# Patient Record
Sex: Male | Born: 1950 | Race: White | Hispanic: No | Smoking: Current every day smoker
Health system: Southern US, Community
[De-identification: ages and names within clinical notes are randomized; demographics above are authoritative.]

## PROBLEM LIST (undated history)

## (undated) ENCOUNTER — Emergency Department (HOSPITAL_BASED_OUTPATIENT_CLINIC_OR_DEPARTMENT_OTHER): Admission: EM

## (undated) DIAGNOSIS — M1990 Unspecified osteoarthritis, unspecified site: Secondary | ICD-10-CM

## (undated) DIAGNOSIS — E785 Hyperlipidemia, unspecified: Secondary | ICD-10-CM

## (undated) DIAGNOSIS — I10 Essential (primary) hypertension: Secondary | ICD-10-CM

---

## 2004-09-08 ENCOUNTER — Encounter: Admission: RE | Admit: 2004-09-08 | Discharge: 2004-09-08 | Payer: Self-pay | Admitting: Occupational Medicine

## 2006-02-01 IMAGING — CR DG FINGER MIDDLE 2+V*L*
1 series · 1 of 1 positions shown · non-contrast
Comparison: none

CLINICAL DATA: Crush injury with distal middle left finger pain.
 DIAGNOSTIC FINGER MIDDLE LEFT ? 09/08/04: 
 There is no evidence of fracture or dislocation. No other soft tissue or bone abnormalities are identified.

[view not recorded]
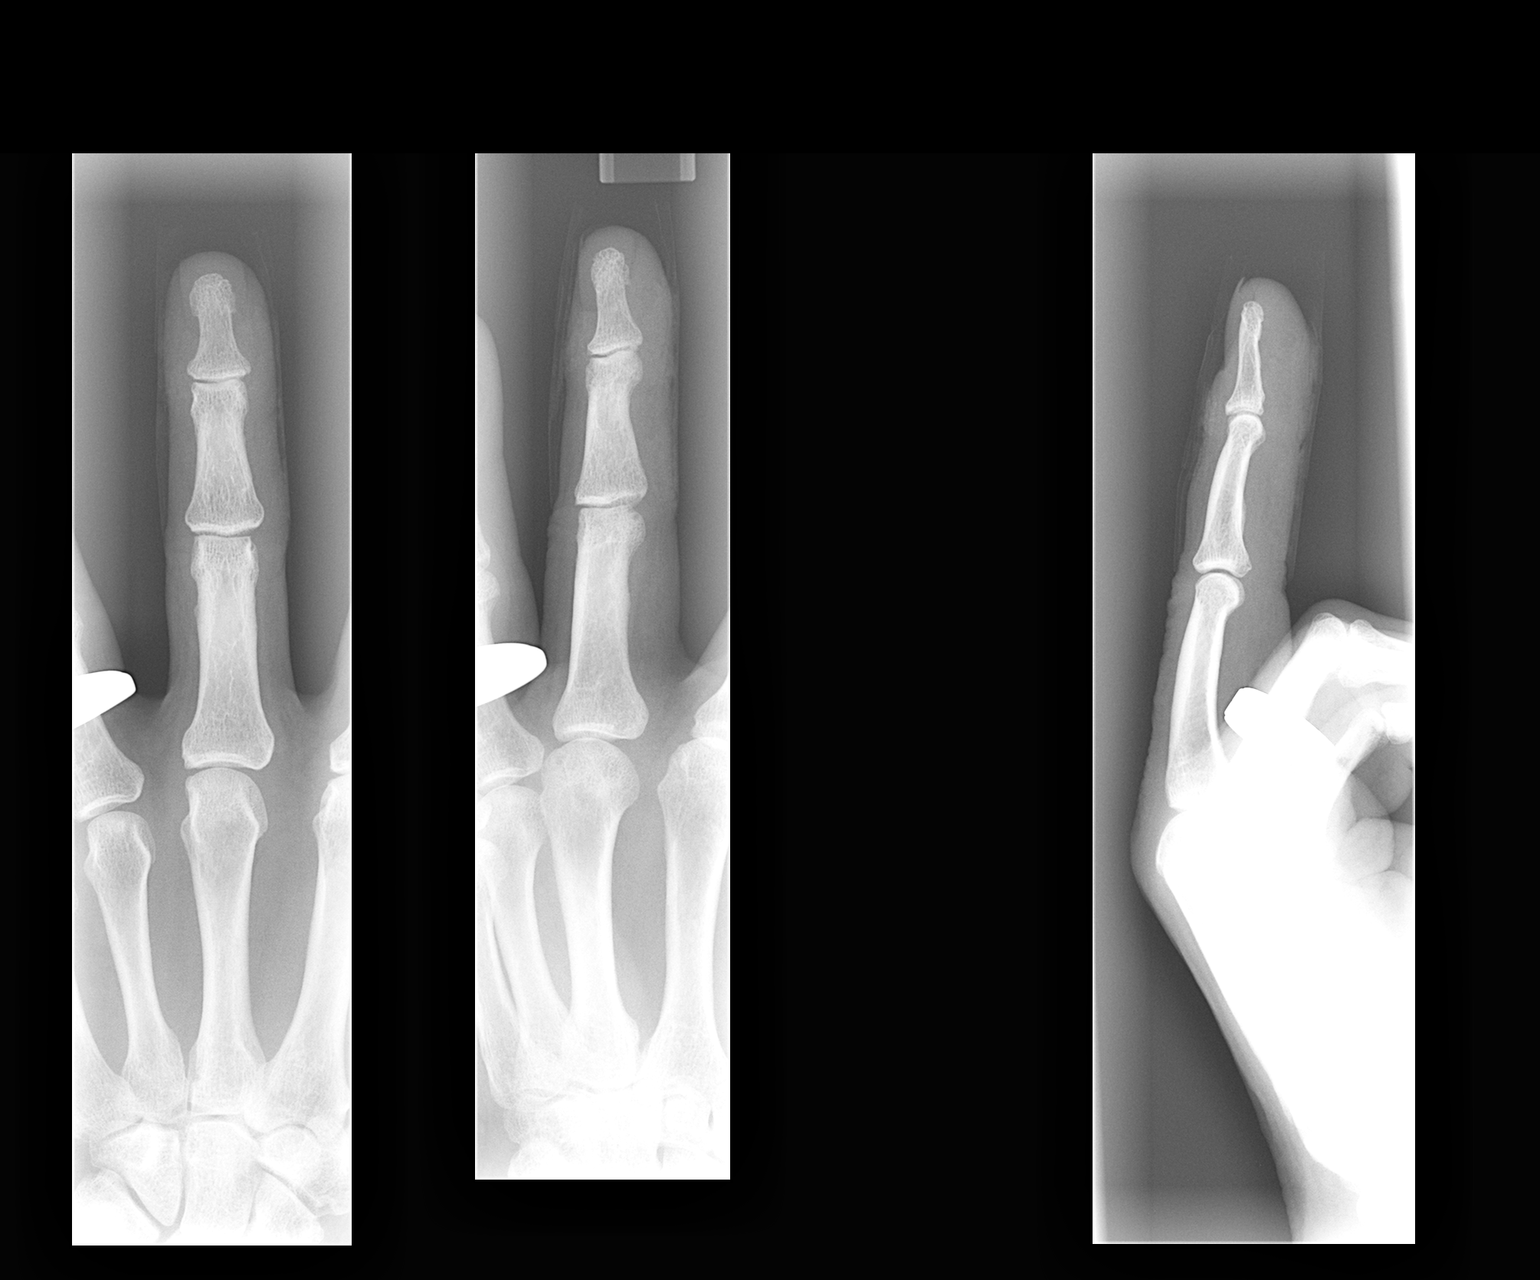

[1 of 1 positions shown; findings below may reference images not displayed]

IMPRESSION: Normal study.

## 2013-01-31 DIAGNOSIS — F17200 Nicotine dependence, unspecified, uncomplicated: Secondary | ICD-10-CM | POA: Insufficient documentation

## 2013-01-31 DIAGNOSIS — I10 Essential (primary) hypertension: Secondary | ICD-10-CM | POA: Insufficient documentation

## 2013-01-31 DIAGNOSIS — E785 Hyperlipidemia, unspecified: Secondary | ICD-10-CM | POA: Insufficient documentation

## 2013-01-31 DIAGNOSIS — H1045 Other chronic allergic conjunctivitis: Secondary | ICD-10-CM | POA: Insufficient documentation

## 2013-07-22 DIAGNOSIS — R059 Cough, unspecified: Secondary | ICD-10-CM | POA: Insufficient documentation

## 2013-10-04 DIAGNOSIS — M545 Low back pain, unspecified: Secondary | ICD-10-CM | POA: Insufficient documentation

## 2014-01-17 DIAGNOSIS — G4700 Insomnia, unspecified: Secondary | ICD-10-CM | POA: Insufficient documentation

## 2014-01-26 DIAGNOSIS — E119 Type 2 diabetes mellitus without complications: Secondary | ICD-10-CM | POA: Insufficient documentation

## 2014-03-18 DIAGNOSIS — M19049 Primary osteoarthritis, unspecified hand: Secondary | ICD-10-CM | POA: Insufficient documentation

## 2014-12-10 DIAGNOSIS — M25541 Pain in joints of right hand: Secondary | ICD-10-CM | POA: Insufficient documentation

## 2015-03-25 DIAGNOSIS — I714 Abdominal aortic aneurysm, without rupture, unspecified: Secondary | ICD-10-CM | POA: Insufficient documentation

## 2015-03-29 DIAGNOSIS — Z9889 Other specified postprocedural states: Secondary | ICD-10-CM | POA: Insufficient documentation

## 2017-01-08 DIAGNOSIS — H3530 Unspecified macular degeneration: Secondary | ICD-10-CM | POA: Insufficient documentation

## 2017-05-22 DIAGNOSIS — M479 Spondylosis, unspecified: Secondary | ICD-10-CM | POA: Insufficient documentation

## 2018-02-21 DIAGNOSIS — Z720 Tobacco use: Secondary | ICD-10-CM | POA: Diagnosis not present

## 2018-04-18 DIAGNOSIS — R7303 Prediabetes: Secondary | ICD-10-CM | POA: Diagnosis not present

## 2018-04-18 DIAGNOSIS — Z7982 Long term (current) use of aspirin: Secondary | ICD-10-CM | POA: Diagnosis not present

## 2018-04-18 DIAGNOSIS — I10 Essential (primary) hypertension: Secondary | ICD-10-CM | POA: Diagnosis not present

## 2018-04-18 DIAGNOSIS — Z87891 Personal history of nicotine dependence: Secondary | ICD-10-CM | POA: Diagnosis not present

## 2018-04-18 DIAGNOSIS — E785 Hyperlipidemia, unspecified: Secondary | ICD-10-CM | POA: Diagnosis not present

## 2018-05-23 DIAGNOSIS — Z23 Encounter for immunization: Secondary | ICD-10-CM | POA: Diagnosis not present

## 2018-06-13 DIAGNOSIS — H524 Presbyopia: Secondary | ICD-10-CM | POA: Diagnosis not present

## 2018-06-13 DIAGNOSIS — H2513 Age-related nuclear cataract, bilateral: Secondary | ICD-10-CM | POA: Diagnosis not present

## 2018-06-13 DIAGNOSIS — H353132 Nonexudative age-related macular degeneration, bilateral, intermediate dry stage: Secondary | ICD-10-CM | POA: Diagnosis not present

## 2018-06-13 DIAGNOSIS — D23121 Other benign neoplasm of skin of left upper eyelid, including canthus: Secondary | ICD-10-CM | POA: Diagnosis not present

## 2018-06-13 DIAGNOSIS — H04123 Dry eye syndrome of bilateral lacrimal glands: Secondary | ICD-10-CM | POA: Diagnosis not present

## 2018-06-20 DIAGNOSIS — H2513 Age-related nuclear cataract, bilateral: Secondary | ICD-10-CM | POA: Diagnosis not present

## 2018-06-20 DIAGNOSIS — H353132 Nonexudative age-related macular degeneration, bilateral, intermediate dry stage: Secondary | ICD-10-CM | POA: Diagnosis not present

## 2018-06-20 DIAGNOSIS — H21543 Posterior synechiae (iris), bilateral: Secondary | ICD-10-CM | POA: Diagnosis not present

## 2018-06-20 DIAGNOSIS — H40033 Anatomical narrow angle, bilateral: Secondary | ICD-10-CM | POA: Diagnosis not present

## 2018-06-20 DIAGNOSIS — H04123 Dry eye syndrome of bilateral lacrimal glands: Secondary | ICD-10-CM | POA: Diagnosis not present

## 2019-02-12 DIAGNOSIS — E089 Diabetes mellitus due to underlying condition without complications: Secondary | ICD-10-CM | POA: Diagnosis not present

## 2019-02-12 DIAGNOSIS — I10 Essential (primary) hypertension: Secondary | ICD-10-CM | POA: Diagnosis not present

## 2019-02-12 DIAGNOSIS — R7303 Prediabetes: Secondary | ICD-10-CM | POA: Diagnosis not present

## 2019-02-12 DIAGNOSIS — I714 Abdominal aortic aneurysm, without rupture: Secondary | ICD-10-CM | POA: Diagnosis not present

## 2019-02-12 DIAGNOSIS — E785 Hyperlipidemia, unspecified: Secondary | ICD-10-CM | POA: Diagnosis not present

## 2019-04-20 DIAGNOSIS — G8929 Other chronic pain: Secondary | ICD-10-CM | POA: Diagnosis not present

## 2019-04-20 DIAGNOSIS — M5442 Lumbago with sciatica, left side: Secondary | ICD-10-CM | POA: Diagnosis not present

## 2019-04-20 DIAGNOSIS — M545 Low back pain: Secondary | ICD-10-CM | POA: Diagnosis not present

## 2019-06-03 DIAGNOSIS — Z23 Encounter for immunization: Secondary | ICD-10-CM | POA: Diagnosis not present

## 2019-06-23 DIAGNOSIS — M7542 Impingement syndrome of left shoulder: Secondary | ICD-10-CM | POA: Diagnosis not present

## 2019-07-09 DIAGNOSIS — M7542 Impingement syndrome of left shoulder: Secondary | ICD-10-CM | POA: Diagnosis not present

## 2019-07-09 DIAGNOSIS — M25512 Pain in left shoulder: Secondary | ICD-10-CM | POA: Diagnosis not present

## 2019-08-14 DIAGNOSIS — I10 Essential (primary) hypertension: Secondary | ICD-10-CM | POA: Diagnosis not present

## 2019-08-14 DIAGNOSIS — R7303 Prediabetes: Secondary | ICD-10-CM | POA: Diagnosis not present

## 2019-08-14 DIAGNOSIS — F17210 Nicotine dependence, cigarettes, uncomplicated: Secondary | ICD-10-CM | POA: Diagnosis not present

## 2019-08-14 DIAGNOSIS — M7542 Impingement syndrome of left shoulder: Secondary | ICD-10-CM | POA: Diagnosis not present

## 2019-08-14 DIAGNOSIS — E785 Hyperlipidemia, unspecified: Secondary | ICD-10-CM | POA: Diagnosis not present

## 2019-09-03 DIAGNOSIS — M75102 Unspecified rotator cuff tear or rupture of left shoulder, not specified as traumatic: Secondary | ICD-10-CM | POA: Diagnosis not present

## 2019-09-14 DIAGNOSIS — S43402A Unspecified sprain of left shoulder joint, initial encounter: Secondary | ICD-10-CM | POA: Diagnosis not present

## 2019-09-14 DIAGNOSIS — M75102 Unspecified rotator cuff tear or rupture of left shoulder, not specified as traumatic: Secondary | ICD-10-CM | POA: Diagnosis not present

## 2019-09-14 DIAGNOSIS — S46012A Strain of muscle(s) and tendon(s) of the rotator cuff of left shoulder, initial encounter: Secondary | ICD-10-CM | POA: Diagnosis not present

## 2019-09-17 DIAGNOSIS — M75102 Unspecified rotator cuff tear or rupture of left shoulder, not specified as traumatic: Secondary | ICD-10-CM | POA: Diagnosis not present

## 2019-09-23 DIAGNOSIS — Z01818 Encounter for other preprocedural examination: Secondary | ICD-10-CM | POA: Diagnosis not present

## 2019-09-23 DIAGNOSIS — I493 Ventricular premature depolarization: Secondary | ICD-10-CM | POA: Diagnosis not present

## 2019-09-23 DIAGNOSIS — M75102 Unspecified rotator cuff tear or rupture of left shoulder, not specified as traumatic: Secondary | ICD-10-CM | POA: Diagnosis not present

## 2019-09-23 DIAGNOSIS — R9431 Abnormal electrocardiogram [ECG] [EKG]: Secondary | ICD-10-CM | POA: Diagnosis not present

## 2019-09-23 DIAGNOSIS — I498 Other specified cardiac arrhythmias: Secondary | ICD-10-CM | POA: Diagnosis not present

## 2019-09-24 DIAGNOSIS — M75102 Unspecified rotator cuff tear or rupture of left shoulder, not specified as traumatic: Secondary | ICD-10-CM | POA: Diagnosis not present

## 2019-09-24 DIAGNOSIS — Z20822 Contact with and (suspected) exposure to covid-19: Secondary | ICD-10-CM | POA: Diagnosis not present

## 2019-09-24 DIAGNOSIS — Z01812 Encounter for preprocedural laboratory examination: Secondary | ICD-10-CM | POA: Diagnosis not present

## 2019-09-30 DIAGNOSIS — M65812 Other synovitis and tenosynovitis, left shoulder: Secondary | ICD-10-CM | POA: Diagnosis not present

## 2019-09-30 DIAGNOSIS — M75102 Unspecified rotator cuff tear or rupture of left shoulder, not specified as traumatic: Secondary | ICD-10-CM | POA: Diagnosis not present

## 2019-09-30 DIAGNOSIS — M75122 Complete rotator cuff tear or rupture of left shoulder, not specified as traumatic: Secondary | ICD-10-CM | POA: Diagnosis not present

## 2019-09-30 DIAGNOSIS — M7552 Bursitis of left shoulder: Secondary | ICD-10-CM | POA: Diagnosis not present

## 2019-09-30 DIAGNOSIS — G8918 Other acute postprocedural pain: Secondary | ICD-10-CM | POA: Diagnosis not present

## 2019-10-07 DIAGNOSIS — Z4789 Encounter for other orthopedic aftercare: Secondary | ICD-10-CM | POA: Diagnosis not present

## 2019-10-13 DIAGNOSIS — F329 Major depressive disorder, single episode, unspecified: Secondary | ICD-10-CM | POA: Diagnosis not present

## 2019-10-13 DIAGNOSIS — N529 Male erectile dysfunction, unspecified: Secondary | ICD-10-CM | POA: Diagnosis not present

## 2019-10-13 DIAGNOSIS — Z0000 Encounter for general adult medical examination without abnormal findings: Secondary | ICD-10-CM | POA: Diagnosis not present

## 2019-10-13 DIAGNOSIS — I714 Abdominal aortic aneurysm, without rupture: Secondary | ICD-10-CM | POA: Diagnosis not present

## 2019-10-13 DIAGNOSIS — G4700 Insomnia, unspecified: Secondary | ICD-10-CM | POA: Diagnosis not present

## 2019-10-13 DIAGNOSIS — E089 Diabetes mellitus due to underlying condition without complications: Secondary | ICD-10-CM | POA: Diagnosis not present

## 2019-10-14 DIAGNOSIS — M75102 Unspecified rotator cuff tear or rupture of left shoulder, not specified as traumatic: Secondary | ICD-10-CM | POA: Diagnosis not present

## 2019-10-14 DIAGNOSIS — M25612 Stiffness of left shoulder, not elsewhere classified: Secondary | ICD-10-CM | POA: Diagnosis not present

## 2019-10-14 DIAGNOSIS — M6281 Muscle weakness (generalized): Secondary | ICD-10-CM | POA: Diagnosis not present

## 2019-10-14 DIAGNOSIS — M25512 Pain in left shoulder: Secondary | ICD-10-CM | POA: Diagnosis not present

## 2019-10-21 DIAGNOSIS — Z4789 Encounter for other orthopedic aftercare: Secondary | ICD-10-CM | POA: Diagnosis not present

## 2019-10-29 DIAGNOSIS — Z4789 Encounter for other orthopedic aftercare: Secondary | ICD-10-CM | POA: Diagnosis not present

## 2019-11-05 DIAGNOSIS — M6281 Muscle weakness (generalized): Secondary | ICD-10-CM | POA: Diagnosis not present

## 2019-11-05 DIAGNOSIS — M25612 Stiffness of left shoulder, not elsewhere classified: Secondary | ICD-10-CM | POA: Diagnosis not present

## 2019-11-05 DIAGNOSIS — Z4789 Encounter for other orthopedic aftercare: Secondary | ICD-10-CM | POA: Diagnosis not present

## 2019-11-05 DIAGNOSIS — M25512 Pain in left shoulder: Secondary | ICD-10-CM | POA: Diagnosis not present

## 2019-11-10 DIAGNOSIS — M25612 Stiffness of left shoulder, not elsewhere classified: Secondary | ICD-10-CM | POA: Diagnosis not present

## 2019-11-10 DIAGNOSIS — M6281 Muscle weakness (generalized): Secondary | ICD-10-CM | POA: Diagnosis not present

## 2019-11-10 DIAGNOSIS — Z4789 Encounter for other orthopedic aftercare: Secondary | ICD-10-CM | POA: Diagnosis not present

## 2019-11-10 DIAGNOSIS — M25512 Pain in left shoulder: Secondary | ICD-10-CM | POA: Diagnosis not present

## 2019-11-13 DIAGNOSIS — M79651 Pain in right thigh: Secondary | ICD-10-CM | POA: Diagnosis not present

## 2019-11-17 DIAGNOSIS — M25612 Stiffness of left shoulder, not elsewhere classified: Secondary | ICD-10-CM | POA: Diagnosis not present

## 2019-11-17 DIAGNOSIS — Z4789 Encounter for other orthopedic aftercare: Secondary | ICD-10-CM | POA: Diagnosis not present

## 2019-11-17 DIAGNOSIS — M6281 Muscle weakness (generalized): Secondary | ICD-10-CM | POA: Diagnosis not present

## 2019-11-17 DIAGNOSIS — M25512 Pain in left shoulder: Secondary | ICD-10-CM | POA: Diagnosis not present

## 2019-11-19 DIAGNOSIS — M6281 Muscle weakness (generalized): Secondary | ICD-10-CM | POA: Diagnosis not present

## 2019-11-19 DIAGNOSIS — M25612 Stiffness of left shoulder, not elsewhere classified: Secondary | ICD-10-CM | POA: Diagnosis not present

## 2019-11-19 DIAGNOSIS — Z4789 Encounter for other orthopedic aftercare: Secondary | ICD-10-CM | POA: Diagnosis not present

## 2019-11-19 DIAGNOSIS — M25512 Pain in left shoulder: Secondary | ICD-10-CM | POA: Diagnosis not present

## 2019-12-11 DIAGNOSIS — D23122 Other benign neoplasm of skin of left lower eyelid, including canthus: Secondary | ICD-10-CM | POA: Diagnosis not present

## 2019-12-11 DIAGNOSIS — H21543 Posterior synechiae (iris), bilateral: Secondary | ICD-10-CM | POA: Diagnosis not present

## 2019-12-11 DIAGNOSIS — H2513 Age-related nuclear cataract, bilateral: Secondary | ICD-10-CM | POA: Diagnosis not present

## 2019-12-11 DIAGNOSIS — H04123 Dry eye syndrome of bilateral lacrimal glands: Secondary | ICD-10-CM | POA: Diagnosis not present

## 2019-12-11 DIAGNOSIS — H40033 Anatomical narrow angle, bilateral: Secondary | ICD-10-CM | POA: Diagnosis not present

## 2019-12-11 DIAGNOSIS — H353132 Nonexudative age-related macular degeneration, bilateral, intermediate dry stage: Secondary | ICD-10-CM | POA: Diagnosis not present

## 2020-01-13 DIAGNOSIS — H2513 Age-related nuclear cataract, bilateral: Secondary | ICD-10-CM | POA: Diagnosis not present

## 2020-01-13 DIAGNOSIS — H2512 Age-related nuclear cataract, left eye: Secondary | ICD-10-CM | POA: Diagnosis not present

## 2020-01-26 DIAGNOSIS — H21542 Posterior synechiae (iris), left eye: Secondary | ICD-10-CM | POA: Diagnosis not present

## 2020-01-26 DIAGNOSIS — H2512 Age-related nuclear cataract, left eye: Secondary | ICD-10-CM | POA: Diagnosis not present

## 2020-01-26 DIAGNOSIS — H21543 Posterior synechiae (iris), bilateral: Secondary | ICD-10-CM | POA: Diagnosis not present

## 2020-02-05 DIAGNOSIS — H2511 Age-related nuclear cataract, right eye: Secondary | ICD-10-CM | POA: Diagnosis not present

## 2020-03-15 DIAGNOSIS — H21541 Posterior synechiae (iris), right eye: Secondary | ICD-10-CM | POA: Diagnosis not present

## 2020-03-15 DIAGNOSIS — H21543 Posterior synechiae (iris), bilateral: Secondary | ICD-10-CM | POA: Diagnosis not present

## 2020-03-15 DIAGNOSIS — H2511 Age-related nuclear cataract, right eye: Secondary | ICD-10-CM | POA: Diagnosis not present

## 2020-05-05 DIAGNOSIS — L723 Sebaceous cyst: Secondary | ICD-10-CM | POA: Diagnosis not present

## 2020-05-05 DIAGNOSIS — L03317 Cellulitis of buttock: Secondary | ICD-10-CM | POA: Diagnosis not present

## 2020-05-10 DIAGNOSIS — B957 Other staphylococcus as the cause of diseases classified elsewhere: Secondary | ICD-10-CM | POA: Diagnosis not present

## 2020-05-10 DIAGNOSIS — L98491 Non-pressure chronic ulcer of skin of other sites limited to breakdown of skin: Secondary | ICD-10-CM | POA: Diagnosis not present

## 2020-05-13 DIAGNOSIS — L98491 Non-pressure chronic ulcer of skin of other sites limited to breakdown of skin: Secondary | ICD-10-CM | POA: Diagnosis not present

## 2020-05-19 DIAGNOSIS — L98491 Non-pressure chronic ulcer of skin of other sites limited to breakdown of skin: Secondary | ICD-10-CM | POA: Diagnosis not present

## 2020-05-26 DIAGNOSIS — L98422 Non-pressure chronic ulcer of back with fat layer exposed: Secondary | ICD-10-CM | POA: Diagnosis not present

## 2020-05-26 DIAGNOSIS — E08622 Diabetes mellitus due to underlying condition with other skin ulcer: Secondary | ICD-10-CM | POA: Diagnosis not present

## 2020-05-26 DIAGNOSIS — E11622 Type 2 diabetes mellitus with other skin ulcer: Secondary | ICD-10-CM | POA: Diagnosis not present

## 2020-05-31 DIAGNOSIS — E08622 Diabetes mellitus due to underlying condition with other skin ulcer: Secondary | ICD-10-CM | POA: Diagnosis not present

## 2020-05-31 DIAGNOSIS — L98422 Non-pressure chronic ulcer of back with fat layer exposed: Secondary | ICD-10-CM | POA: Diagnosis not present

## 2020-06-29 DIAGNOSIS — E08622 Diabetes mellitus due to underlying condition with other skin ulcer: Secondary | ICD-10-CM | POA: Diagnosis not present

## 2020-06-29 DIAGNOSIS — L98422 Non-pressure chronic ulcer of back with fat layer exposed: Secondary | ICD-10-CM | POA: Diagnosis not present

## 2020-08-02 DIAGNOSIS — E08622 Diabetes mellitus due to underlying condition with other skin ulcer: Secondary | ICD-10-CM | POA: Diagnosis not present

## 2020-08-02 DIAGNOSIS — L98422 Non-pressure chronic ulcer of back with fat layer exposed: Secondary | ICD-10-CM | POA: Diagnosis not present

## 2020-10-18 DIAGNOSIS — Z0001 Encounter for general adult medical examination with abnormal findings: Secondary | ICD-10-CM | POA: Diagnosis not present

## 2020-10-18 DIAGNOSIS — I10 Essential (primary) hypertension: Secondary | ICD-10-CM | POA: Diagnosis not present

## 2020-10-18 DIAGNOSIS — Z0000 Encounter for general adult medical examination without abnormal findings: Secondary | ICD-10-CM | POA: Diagnosis not present

## 2020-10-18 DIAGNOSIS — Z125 Encounter for screening for malignant neoplasm of prostate: Secondary | ICD-10-CM | POA: Diagnosis not present

## 2020-10-18 DIAGNOSIS — N529 Male erectile dysfunction, unspecified: Secondary | ICD-10-CM | POA: Diagnosis not present

## 2020-10-18 DIAGNOSIS — F17210 Nicotine dependence, cigarettes, uncomplicated: Secondary | ICD-10-CM | POA: Diagnosis not present

## 2020-10-18 DIAGNOSIS — E785 Hyperlipidemia, unspecified: Secondary | ICD-10-CM | POA: Diagnosis not present

## 2020-10-18 DIAGNOSIS — E089 Diabetes mellitus due to underlying condition without complications: Secondary | ICD-10-CM | POA: Diagnosis not present

## 2020-10-18 DIAGNOSIS — Z23 Encounter for immunization: Secondary | ICD-10-CM | POA: Diagnosis not present

## 2020-10-18 DIAGNOSIS — I714 Abdominal aortic aneurysm, without rupture: Secondary | ICD-10-CM | POA: Diagnosis not present

## 2020-11-04 DIAGNOSIS — H21543 Posterior synechiae (iris), bilateral: Secondary | ICD-10-CM | POA: Diagnosis not present

## 2020-11-04 DIAGNOSIS — H04123 Dry eye syndrome of bilateral lacrimal glands: Secondary | ICD-10-CM | POA: Diagnosis not present

## 2020-11-04 DIAGNOSIS — H353132 Nonexudative age-related macular degeneration, bilateral, intermediate dry stage: Secondary | ICD-10-CM | POA: Diagnosis not present

## 2020-11-04 DIAGNOSIS — D23122 Other benign neoplasm of skin of left lower eyelid, including canthus: Secondary | ICD-10-CM | POA: Diagnosis not present

## 2020-11-04 DIAGNOSIS — H43823 Vitreomacular adhesion, bilateral: Secondary | ICD-10-CM | POA: Diagnosis not present

## 2020-11-18 DIAGNOSIS — H02413 Mechanical ptosis of bilateral eyelids: Secondary | ICD-10-CM | POA: Diagnosis not present

## 2020-11-18 DIAGNOSIS — D485 Neoplasm of uncertain behavior of skin: Secondary | ICD-10-CM | POA: Diagnosis not present

## 2020-11-18 DIAGNOSIS — H02834 Dermatochalasis of left upper eyelid: Secondary | ICD-10-CM | POA: Diagnosis not present

## 2020-11-18 DIAGNOSIS — H02831 Dermatochalasis of right upper eyelid: Secondary | ICD-10-CM | POA: Diagnosis not present

## 2020-11-18 DIAGNOSIS — H57813 Brow ptosis, bilateral: Secondary | ICD-10-CM | POA: Diagnosis not present

## 2020-11-18 DIAGNOSIS — D22112 Melanocytic nevi of right lower eyelid, including canthus: Secondary | ICD-10-CM | POA: Diagnosis not present

## 2021-01-02 DIAGNOSIS — B028 Zoster with other complications: Secondary | ICD-10-CM | POA: Diagnosis not present

## 2021-01-03 DIAGNOSIS — D210 Benign neoplasm of connective and other soft tissue of head, face and neck: Secondary | ICD-10-CM | POA: Diagnosis not present

## 2021-01-03 DIAGNOSIS — D485 Neoplasm of uncertain behavior of skin: Secondary | ICD-10-CM | POA: Diagnosis not present

## 2021-04-06 DIAGNOSIS — E119 Type 2 diabetes mellitus without complications: Secondary | ICD-10-CM | POA: Diagnosis not present

## 2021-04-06 DIAGNOSIS — I10 Essential (primary) hypertension: Secondary | ICD-10-CM | POA: Diagnosis not present

## 2021-04-06 DIAGNOSIS — L239 Allergic contact dermatitis, unspecified cause: Secondary | ICD-10-CM | POA: Diagnosis not present

## 2021-04-06 DIAGNOSIS — Z79899 Other long term (current) drug therapy: Secondary | ICD-10-CM | POA: Diagnosis not present

## 2021-04-06 DIAGNOSIS — D72829 Elevated white blood cell count, unspecified: Secondary | ICD-10-CM | POA: Diagnosis not present

## 2021-04-06 DIAGNOSIS — F17210 Nicotine dependence, cigarettes, uncomplicated: Secondary | ICD-10-CM | POA: Diagnosis not present

## 2021-04-06 DIAGNOSIS — I714 Abdominal aortic aneurysm, without rupture: Secondary | ICD-10-CM | POA: Diagnosis not present

## 2021-04-06 DIAGNOSIS — E089 Diabetes mellitus due to underlying condition without complications: Secondary | ICD-10-CM | POA: Diagnosis not present

## 2021-04-06 DIAGNOSIS — E785 Hyperlipidemia, unspecified: Secondary | ICD-10-CM | POA: Diagnosis not present

## 2021-04-06 DIAGNOSIS — Z7982 Long term (current) use of aspirin: Secondary | ICD-10-CM | POA: Diagnosis not present

## 2022-09-04 ENCOUNTER — Emergency Department (HOSPITAL_BASED_OUTPATIENT_CLINIC_OR_DEPARTMENT_OTHER): Payer: Medicare PPO

## 2022-09-04 ENCOUNTER — Emergency Department (HOSPITAL_BASED_OUTPATIENT_CLINIC_OR_DEPARTMENT_OTHER)
Admission: EM | Admit: 2022-09-04 | Discharge: 2022-09-04 | Disposition: A | Discharge status: Home / Self Care | Payer: Medicare PPO | Attending: Emergency Medicine | Admitting: Emergency Medicine

## 2022-09-04 ENCOUNTER — Encounter (HOSPITAL_BASED_OUTPATIENT_CLINIC_OR_DEPARTMENT_OTHER): Payer: Self-pay

## 2022-09-04 DIAGNOSIS — M79641 Pain in right hand: Secondary | ICD-10-CM | POA: Diagnosis not present

## 2022-09-04 DIAGNOSIS — M1990 Unspecified osteoarthritis, unspecified site: Secondary | ICD-10-CM

## 2022-09-04 HISTORY — DX: Essential (primary) hypertension: I10

## 2022-09-04 HISTORY — DX: Hyperlipidemia, unspecified: E78.5

## 2022-09-04 MED ORDER — METHYLPREDNISOLONE 4 MG PO TBPK: ORAL_TABLET | ORAL | 0 refills | Status: DC

## 2022-09-04 MED ORDER — PREDNISONE 10 MG PO TABS: 30.0000 mg | ORAL_TABLET | Freq: Every day | ORAL | 0 refills | Status: DC

## 2022-09-04 MED ORDER — HYDROCODONE-ACETAMINOPHEN 5-325 MG PO TABS: 1.0000 | ORAL_TABLET | Freq: Once | ORAL | Status: DC

## 2022-09-04 MED ORDER — ZIKS ARTHRITIS PAIN RELIEF 0.025-1-12 % EX CREA: 1.0000 | TOPICAL_CREAM | Freq: Two times a day (BID) | CUTANEOUS | 0 refills | Status: AC

## 2022-09-04 MED ORDER — KETOROLAC TROMETHAMINE 60 MG/2ML IM SOLN
30.0000 mg | Freq: Once | INTRAMUSCULAR | Status: AC
  Administered 2022-09-04: 30 mg via INTRAMUSCULAR
  Filled 2022-09-04: qty 2

## 2022-09-04 MED ORDER — ACETAMINOPHEN 500 MG PO TABS
1000.0000 mg | ORAL_TABLET | Freq: Once | ORAL | Status: AC
  Administered 2022-09-04: 1000 mg via ORAL
  Filled 2022-09-04: qty 2

## 2022-09-04 NOTE — ED Notes (Signed)
Pt states he will be driving home and can't get a ride, holding norco for now, requesting other form of pain relief from provider

## 2022-09-04 NOTE — ED Triage Notes (Signed)
C/o hand pain x 1 month, has had increased swelling the past few days. Denies injury. States hand has been going numb intermittently for the past few months

## 2022-09-04 NOTE — Discharge Instructions (Addendum)
You have been seen and discharged from the emergency department.  Your x-ray shows severe arthritis in multiple joints of the right hand.  Take steroids as directed, use topical medicine as needed.  Take Tylenol for pain control.  Stay well-hydrated.  Make sure you take steroids with food.establish care with orthopedics so that they may follow-up this arthritis, further classify the type of arthritis and giving more definitive care.  Avoid any strenuous activity in the right hand as this will irritate the inflammation.  You may ice the hand for comfort.  Take home medications as prescribed. If you have any worsening symptoms, severe hand swelling, hand discoloration (blue/red), fevers or further concerns for your health please return to an emergency department for further evaluation.

## 2022-09-04 NOTE — ED Provider Notes (Signed)
[facility] EMERGENCY DEPARTMENT Provider Note   CSN: [id number] Arrival date & time: 09/04/22  0754     History  Chief Complaint  Patient presents with   Hand Pain    Adam Melendez is a 72 y.o. male.  HPI   72 year old male presents emergency department with right hand pain.  Patient states this has been intermittent for the past couple weeks, he has been experiencing some numbness along the right thumb.  This progressed last night to severe pain along the first 3-4 digits and dorsal aspect of the hand.  He denies any cool sensation to the hand, no hand discoloration, he appreciates mild swelling of the hands/joints.  No history of gout.  No fever.  No injury to the hand.  Home Medications Prior to Admission medications   Not on File      Allergies    Patient has no known allergies.    Review of Systems   Review of Systems  Constitutional:  Negative for fever.  Respiratory:  Negative for shortness of breath.   Cardiovascular:  Negative for chest pain.  Musculoskeletal:        Right hand pain, swelling  Neurological:  Positive for numbness. Negative for weakness and headaches.    Physical Exam Updated Vital Signs BP 119/76   Pulse 66   Temp 97.7 F (36.5 C) (Oral)   Resp 17   Ht '5\' 11"'$  (1.803 m)   Wt 78 kg   SpO2 94%   BMI 23.99 kg/m  Physical Exam Vitals and nursing note reviewed.  Constitutional:      Appearance: Normal appearance.  HENT:     Head: Normocephalic.     Mouth/Throat:     Mouth: Mucous membranes are moist.  Cardiovascular:     Rate and Rhythm: Normal rate.  Pulmonary:     Effort: Pulmonary effort is normal.  Musculoskeletal:     Comments: Right hand is vascularly intact, equal palpable radial pulse, normal capillary refill in all 5 fingers, no cyanosis/discoloration.  He has some mild decrease sensation along the lateral aspect of the thumb.  Otherwise he has tenderness to palpation of the proximal joints of fingers 1 through 4.   No palmar pain or discoloration.  No wrist drop.  Fingernails are intact and no other wounds noted.  Skin:    General: Skin is warm.  Neurological:     Mental Status: He is alert and oriented to person, place, and time. Mental status is at baseline.  Psychiatric:        Mood and Affect: Mood normal.     ED Results / Procedures / Treatments   Labs (all labs ordered are listed, but only abnormal results are displayed) Labs Reviewed - No data to display  EKG None  Radiology DG Hand Complete Right  Result Date: 09/04/2022 CLINICAL DATA:  Severe hand pain for the past month with increased swelling over the past few days. Denies injury. EXAM: RIGHT HAND - COMPLETE 3+ VIEW COMPARISON:  Right hand x-rays dated August 17, 2014. FINDINGS: No acute fracture or dislocation. Progressive moderate to severe second through fourth MCP joint space narrowing. Remaining joint spaces are preserved. Bone mineralization is normal. Faint chondrocalcinosis of the TFCC. IMPRESSION: 1. 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 Electronically Signed   By: Titus Dubin M.D.   On: 09/04/2022 09:05    Procedures Procedures    Medications Ordered in  ED Medications  ketorolac (TORADOL) injection 30 mg (30 mg Intramuscular Given 09/04/22 0900)  acetaminophen (TYLENOL) tablet 1,000 mg (1,000 mg Oral Given 09/04/22 0900)    ED Course/ Medical Decision Making/ A&P                           Medical Decision Making Amount and/or Complexity of Data Reviewed Radiology: ordered.  Risk OTC drugs. Prescription drug management.   72 year old male presents emergency department right hand pain.  Initially intermittent, recently getting worse.  Pain is along the dorsal aspect of the hand but specifically the first 3-4 joints.  The hand has some mild edema on the dorsal aspect.  Radial pulses are equal, right hand appears vascularly intact,  no discoloration.  No noted wounds or fingernail disease.  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  Will treat the patient symptomatically, and refer to orthopedics outpatient with strict return to ED instructions.  Low suspicion for infection at this time. Patient educated on steroid rx and use.  Patient at this time appears safe and stable for discharge and close outpatient follow up. Discharge plan and strict return to ED precautions discussed, patient verbalizes understanding and agreement.        Final Clinical Impression(s) / ED Diagnoses Final diagnoses:  None    Rx / DC Orders ED Discharge Orders     None         Lorelle Gibbs, DO 09/04/22 1044

## 2022-09-04 NOTE — ED Notes (Signed)
Discharge instructions reviewed with patient. Patient verbalizes understanding, no further questions at this time. Medications/prescriptions and follow up information provided. No acute distress noted at time of departure.  

## 2022-09-15 ENCOUNTER — Encounter (HOSPITAL_BASED_OUTPATIENT_CLINIC_OR_DEPARTMENT_OTHER): Payer: Self-pay | Admitting: Emergency Medicine

## 2022-09-15 ENCOUNTER — Other Ambulatory Visit: Payer: Self-pay

## 2022-09-15 ENCOUNTER — Emergency Department (HOSPITAL_BASED_OUTPATIENT_CLINIC_OR_DEPARTMENT_OTHER)
Admission: EM | Admit: 2022-09-15 | Discharge: 2022-09-15 | Disposition: A | Discharge status: Home / Self Care | Payer: Medicare PPO | Attending: Emergency Medicine | Admitting: Emergency Medicine

## 2022-09-15 DIAGNOSIS — M79641 Pain in right hand: Secondary | ICD-10-CM | POA: Diagnosis present

## 2022-09-15 DIAGNOSIS — M869 Osteomyelitis, unspecified: Secondary | ICD-10-CM | POA: Insufficient documentation

## 2022-09-15 DIAGNOSIS — E119 Type 2 diabetes mellitus without complications: Secondary | ICD-10-CM | POA: Insufficient documentation

## 2022-09-15 DIAGNOSIS — M19041 Primary osteoarthritis, right hand: Secondary | ICD-10-CM

## 2022-09-15 DIAGNOSIS — K219 Gastro-esophageal reflux disease without esophagitis: Secondary | ICD-10-CM | POA: Insufficient documentation

## 2022-09-15 DIAGNOSIS — I10 Essential (primary) hypertension: Secondary | ICD-10-CM | POA: Diagnosis not present

## 2022-09-15 HISTORY — DX: Unspecified osteoarthritis, unspecified site: M19.90

## 2022-09-15 MED ORDER — ACETAMINOPHEN 500 MG PO TABS
1000.0000 mg | ORAL_TABLET | Freq: Once | ORAL | Status: AC
  Administered 2022-09-15: 1000 mg via ORAL
  Filled 2022-09-15: qty 2

## 2022-09-15 MED ORDER — OXYCODONE HCL 5 MG PO TABS
5.0000 mg | ORAL_TABLET | Freq: Once | ORAL | Status: AC
  Administered 2022-09-15: 5 mg via ORAL
  Filled 2022-09-15: qty 1

## 2022-09-15 MED ORDER — DICLOFENAC SODIUM 1 % EX GEL: 2.0000 g | Freq: Once | CUTANEOUS | Status: DC

## 2022-09-15 MED ORDER — COLCHICINE 0.6 MG PO TABS: 0.6000 mg | ORAL_TABLET | Freq: Two times a day (BID) | ORAL | 0 refills | Status: AC

## 2022-09-15 MED ORDER — PREDNISONE 10 MG PO TABS: 30.0000 mg | ORAL_TABLET | Freq: Every day | ORAL | 0 refills | Status: AC

## 2022-09-15 MED ORDER — COLCHICINE 0.6 MG PO TABS
1.2000 mg | ORAL_TABLET | Freq: Once | ORAL | Status: AC
  Administered 2022-09-15: 1.2 mg via ORAL

## 2022-09-15 MED ORDER — COLCHICINE 0.6 MG PO TABS
0.6000 mg | ORAL_TABLET | Freq: Once | ORAL | Status: AC
  Administered 2022-09-15: 0.6 mg via ORAL
  Filled 2022-09-15: qty 1

## 2022-09-15 MED ORDER — COLCHICINE 0.6 MG PO TABS
0.6000 mg | ORAL_TABLET | Freq: Once | ORAL | Status: DC
  Filled 2022-09-15: qty 1

## 2022-09-15 NOTE — Discharge Instructions (Addendum)
Thank you for coming to [facility] Emergency Department.   You can take naproxen 500 mg twice per day.  Colchicine 0.6 mg twice per day. Tylenol (acetaminophen) 1,000 mg three times per day. Rest your hand, elevate it to reduce swelling. You can also ice it for pain relief.  We have prescribed another prednisone taper to help get you to your orthopedic appointment.  Please take your medication as prescribed and not more frequently.  Please follow up with your orthopedic doctor as scheduled next Friday.   Do not hesitate to return to the ED or call 911 if you experience: -Worsening symptoms -Red, hot, swollen hand -Cold blue numb hand -Lightheadedness, passing out -Fevers/chills -Anything else that concerns you

## 2022-09-15 NOTE — ED Triage Notes (Signed)
Pt arrives pov, steady gait with c/o recurrent RT hand pain, hx of arthritis

## 2022-09-15 NOTE — ED Provider Notes (Signed)
[place] [facility] [place] Provider Note   CSN: [id number] Arrival date & time: 09/15/22  9242     History  Chief Complaint  Patient presents with   Hand Pain    Adam Melendez is a 72 y.o. male with T2DM, HTN, reflux, HLD, insomnia, low back pain, AAA, and primary localized osteoarthritis of the hand who presents with hand pain.   Patient presents with right hand pain.  He reports 2 weeks ago he started having right hand pain that he has never had before associated with numbness in his first 3 digits.  Severe rated 10 out of 10 right now.  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  He was prescribed a 3 week prednisone taper and referred to orthopedics.  Yesterday he made an appointment for next Friday but he was awake all night with the pain and states that he cannot take the pain so came to the ED again this morning.  He denies any fever/chills, redness to the hand but endorses that it is swollen the same as it was before.  He still is experiencing the same numbness tingling in his first 3 digits.  He denies any wrist pain or swelling.  States he has limited range of motion of the hand due to pain.  No trauma to the hand.  He was also prescribed naproxen by his primary care physician 500 mg twice daily but he states he has been taking it every 3 hours due to pain for the last 2 days.   Hand Pain       Home Medications Prior to Admission medications   Medication Sig Start Date End Date Taking? Authorizing Provider  colchicine 0.6 MG tablet Take 1 tablet (0.6 mg total) by mouth 2 (two) times daily for 14 days. 09/15/22 09/29/22 Yes Audley Hose, MD  Capsaicin-Menthol-Methyl Sal (CAPSAICIN-METHYL SAL-MENTHOL) 0.025-1-12 % CREA Apply 1 Application topically 2 (two) times daily. 09/04/22   Horton, Alvin Critchley, DO  predniSONE (DELTASONE) 10 MG tablet Take 3 tablets  (30 mg total) by mouth daily. Take 3 tablets (30 mg total) by mouth once daily for 5 week.  Take 2 tablets (20 mg total) once daily for 3 days.  Take 1 tablet (10 mg total) once daily for 2 days. 09/15/22   Audley Hose, MD      Allergies    Patient has no known allergies.    Review of Systems   Review of Systems Review of systems Negative for f/c.  A 10 point review of systems was performed and is negative unless otherwise reported in HPI.  Physical Exam Updated Vital Signs BP (!) 164/87   Pulse 68   Temp 98.3 F (36.8 C) (Oral)   Resp 16   Ht '5\' 11"'$  (1.803 m)   Wt 78 kg   SpO2 99%   BMI 23.99 kg/m  Physical Exam General: Normal appearing male, lying in bed.  HEENT: PERRLA, Sclera anicteric, MMM, trachea midline.  Cardiology: RRR, no murmurs/rubs/gallops. BL radial and DP pulses equal bilaterally.  Resp: Normal respiratory rate and effort. CTAB, no wheezes, rhonchi, crackles.  Abd: Soft, non-tender, non-distended. No rebound tenderness or guarding.  GU: Deferred. MSK: R hand diffusely mildly swollen with no erythema, induration, fluctuance.  Tenderness palpation of the first 3 digits.  Intact radial pulse and good cap refill in all five digits of R hand. Mild decreased sensation  to light touch of the first 3 digits diffusely.  Limited flexion of all 5 digits due to pain.  Other extremities without edema, TTP, or deformities. Skin: warm, dry. No rashes or lesions. Neuro: A&Ox4, CNs II-XII grossly intact. MAEs. Sensation grossly intact.  Psych: Normal mood and affect.   ED Results / Procedures / Treatments   Labs (all labs ordered are listed, but only abnormal results are displayed) Labs Reviewed - No data to display  EKG None  Radiology No results found.  Procedures Procedures    Medications Ordered in ED Medications  acetaminophen (TYLENOL) tablet 1,000 mg (1,000 mg Oral Given 09/15/22 0821)  colchicine tablet 1.2 mg (1.2 mg Oral Given 09/15/22 0823)  colchicine  tablet 0.6 mg (0.6 mg Oral Given 09/15/22 0930)  oxyCODONE (Oxy IR/ROXICODONE) immediate release tablet 5 mg (5 mg Oral Given 09/15/22 0847)    ED Course/ Medical Decision Making/ A&P                          Medical Decision Making Risk OTC drugs. Prescription drug management.    MDM:    Patient had an x-ray at his prior presentation on 09/04/2022 that did not demonstrate any traumatic injury and he has had no trauma since then, no imaging needed today.  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  Given the numbness and tingling in the median nerve distribution consider also possible carpal tunnel syndrome, and tapping over the medial wrist does elicit some pain for the patient.  No concern for septic arthritis and no evidence on exam of deformity or trauma.  He has intact radial pulse and good cap refill, no concern for ischemic etiology of pain.  He has already been referred to orthopedics outpatient and has an appointment scheduled for next Friday.   Patient has had naproxen and prednisone taper at home without significant relief. He has no h/o renal insufficiency and so will add colchicine here 1.2 mg PO load with another 0.6 mg dose in an hour along with tylenol and one single dose of oxycodone here.  Will avoid toradol or diclofenac gel d/t patient taking increased dose of naproxen at home. Will prescribe colchicine 0.6 mg BID to help get patient to orthopedic appt. I discussed with patient the importance of taking the NSAIDs as prescribed and not more frequently as they can cause kidney injury, which could be complicated by the colchicine. Patient is instructed to continue and finish his prednisone taper as well, as prescribed.  Clinical Course as of 09/15/22 1024  Sat Sep 15, 2022  1017 Patient's pain improved after the colchicine, Tylenol, and oxycodone.  I discussed with patient that we will do a colchicine prescription twice per day for the  possibility of pseudogout.  Patient is instructed to not take more naproxen than he was prescribed which is 500 mg twice daily.  On chart review it appears that patient was prescribed a 2-week taper of prednisone which would be finishing in 3 days, but patient states that he has already finished that prescription.  Possible that he was taking that more frequently than intended as well.  I will prescribe patient another short steroid taper to help get him to his appointment and he is instructed to take it as prescribed and not more frequently.  I explained to patient the risks of side effects of steroids and NSAIDs.  Patient reports understanding.  I advised patient 1000 mg  Tylenol every 8 hours as well along with ice and rest and elevation of the hand.  Patient has follow-up with orthopedic surgery next Friday.  He will be discharged with discharge instructions and return precautions.  All questions answered to patient satisfaction. [HN]    Clinical Course User Index [HN] Audley Hose, MD    Additional history obtained from chart review.    Reevaluation: After the interventions noted above, I reevaluated the patient and found that they have :improved  Social Determinants of Health: Patient lives independently  Disposition:  DC w/ discharge instructions, return precautions, colchicine/prednisone taper rx, and already-scheduled ortho f/u   Co morbidities that complicate the patient evaluation  Past Medical History:  Diagnosis Date   Arthritis    Hyperlipidemia    Hypertension      Medicines Meds ordered this encounter  Medications   acetaminophen (TYLENOL) tablet 1,000 mg   DISCONTD: colchicine tablet 0.6 mg   colchicine tablet 1.2 mg   colchicine tablet 0.6 mg   DISCONTD: diclofenac Sodium (VOLTAREN) 1 % topical gel 2 g   oxyCODONE (Oxy IR/ROXICODONE) immediate release tablet 5 mg   colchicine 0.6 MG tablet    Sig: Take 1 tablet (0.6 mg total) by mouth 2 (two) times daily for 14  days.    Dispense:  28 tablet    Refill:  0   predniSONE (DELTASONE) 10 MG tablet    Sig: Take 3 tablets (30 mg total) by mouth daily. Take 3 tablets (30 mg total) by mouth once daily for 5 week.  Take 2 tablets (20 mg total) once daily for 3 days.  Take 1 tablet (10 mg total) once daily for 2 days.    Dispense:  32 tablet    Refill:  0    I have reviewed the patients home medicines and have made adjustments as needed  Problem List / ED Course: Problem List Items Addressed This Visit   None Visit Diagnoses     Arthritis of right hand    -  Primary   Relevant Medications   acetaminophen (TYLENOL) tablet 1,000 mg (Completed)   colchicine tablet 1.2 mg (Completed)   colchicine tablet 0.6 mg (Completed)   oxyCODONE (Oxy IR/ROXICODONE) immediate release tablet 5 mg (Completed)   colchicine 0.6 MG tablet   predniSONE (DELTASONE) 10 MG tablet                   This note was created using dictation software, which may contain spelling or grammatical errors.    Audley Hose, MD 09/15/22 1025

## 2022-10-27 ENCOUNTER — Emergency Department (HOSPITAL_BASED_OUTPATIENT_CLINIC_OR_DEPARTMENT_OTHER)
Admission: EM | Admit: 2022-10-27 | Discharge: 2022-10-27 | Disposition: A | Discharge status: Home / Self Care | Payer: Medicare PPO | Attending: Emergency Medicine | Admitting: Emergency Medicine

## 2022-10-27 ENCOUNTER — Emergency Department (HOSPITAL_BASED_OUTPATIENT_CLINIC_OR_DEPARTMENT_OTHER): Payer: Medicare PPO

## 2022-10-27 ENCOUNTER — Encounter (HOSPITAL_BASED_OUTPATIENT_CLINIC_OR_DEPARTMENT_OTHER): Payer: Self-pay

## 2022-10-27 ENCOUNTER — Other Ambulatory Visit: Payer: Self-pay

## 2022-10-27 DIAGNOSIS — M79641 Pain in right hand: Secondary | ICD-10-CM | POA: Diagnosis present

## 2022-10-27 DIAGNOSIS — L03113 Cellulitis of right upper limb: Secondary | ICD-10-CM | POA: Insufficient documentation

## 2022-10-27 DIAGNOSIS — I10 Essential (primary) hypertension: Secondary | ICD-10-CM | POA: Insufficient documentation

## 2022-10-27 LAB — CBC
HCT: 42.7 % (ref 39.0–52.0)
Hemoglobin: 14.3 g/dL (ref 13.0–17.0)
MCH: 30.4 pg (ref 26.0–34.0)
MCHC: 33.5 g/dL (ref 30.0–36.0)
MCV: 90.7 fL (ref 80.0–100.0)
Platelets: 338 10*3/uL (ref 150–400)
RBC: 4.71 MIL/uL (ref 4.22–5.81)
RDW: 13.9 % (ref 11.5–15.5)
WBC: 13.1 10*3/uL — ABNORMAL HIGH (ref 4.0–10.5)
nRBC: 0 % (ref 0.0–0.2)

## 2022-10-27 LAB — BASIC METABOLIC PANEL
Anion gap: 5 (ref 5–15)
BUN: 12 mg/dL (ref 8–23)
CO2: 19 mmol/L — ABNORMAL LOW (ref 22–32)
Calcium: 8.2 mg/dL — ABNORMAL LOW (ref 8.9–10.3)
Chloride: 110 mmol/L (ref 98–111)
Creatinine, Ser: 0.88 mg/dL (ref 0.61–1.24)
GFR, Estimated: >60 mL/min (ref >60)
Glucose, Bld: 117 mg/dL — ABNORMAL HIGH (ref 70–99)
Potassium: 3.7 mmol/L (ref 3.5–5.1)
Sodium: 134 mmol/L — ABNORMAL LOW (ref 135–145)

## 2022-10-27 LAB — URIC ACID: Uric Acid, Serum: 6 mg/dL (ref 3.7–8.6)

## 2022-10-27 MED ORDER — CEPHALEXIN 250 MG PO CAPS
500.0000 mg | ORAL_CAPSULE | Freq: Once | ORAL | Status: AC
  Administered 2022-10-27: 500 mg via ORAL
  Filled 2022-10-27: qty 2

## 2022-10-27 MED ORDER — IBUPROFEN 800 MG PO TABS
800.0000 mg | ORAL_TABLET | Freq: Once | ORAL | Status: AC
  Administered 2022-10-27: 800 mg via ORAL
  Filled 2022-10-27: qty 1

## 2022-10-27 MED ORDER — CEPHALEXIN 500 MG PO CAPS: 500.0000 mg | ORAL_CAPSULE | Freq: Four times a day (QID) | ORAL | 0 refills | Status: AC

## 2022-10-27 NOTE — ED Triage Notes (Signed)
POV from home, A&O x 4, GCS 15, amb to room.  Pt sts that he woke up this AM with right hand swelling, pain/numbness that radiates from hand to mid forearm. Hx of carpal tunnel receiving steroid injections in wrist, last dose late January.

## 2022-10-27 NOTE — ED Provider Notes (Signed)
[place] [facility] [place] Provider Note   CSN: [id number] Arrival date & time: 10/27/22  [id number]     History  Chief Complaint  Patient presents with   Hand Pain    Adam Melendez is a 72 y.o. male.  HPI 72 year old male history of hypertension syndrome presents today complaining of right hand swelling and pain.  He states he has had some pain in both of his wrist.  He has received multiple steroid injections with the last in the right wrist at the end of January.  Over the past several days he has had increased swelling and pain of the right hand that began on the dorsum of the hand and now has some swelling in his fingers.  He denies any fever, chills, recent injuries     Home Medications Prior to Admission medications   Medication Sig Start Date End Date Taking? Authorizing Provider  cephALEXin (KEFLEX) 500 MG capsule Take 1 capsule (500 mg total) by mouth 4 (four) times daily. 10/27/22  Yes Pattricia Boss, MD  Capsaicin-Menthol-Methyl Sal (CAPSAICIN-METHYL SAL-MENTHOL) 0.025-1-12 % CREA Apply 1 Application topically 2 (two) times daily. 09/04/22   Horton, Alvin Critchley, DO  colchicine 0.6 MG tablet Take 1 tablet (0.6 mg total) by mouth 2 (two) times daily for 14 days. 09/15/22 09/29/22  Audley Hose, MD  predniSONE (DELTASONE) 10 MG tablet Take 3 tablets (30 mg total) by mouth daily. Take 3 tablets (30 mg total) by mouth once daily for 5 week.  Take 2 tablets (20 mg total) once daily for 3 days.  Take 1 tablet (10 mg total) once daily for 2 days. 09/15/22   Audley Hose, MD      Allergies    Patient has no known allergies.    Review of Systems   Review of Systems  Physical Exam Updated Vital Signs BP (!) 150/86   Pulse 78   Temp 97.6 F (36.4 C) (Oral)   Resp 18   Ht 1.803 m ('5\' 11"'$ )   Wt 80.7 kg   SpO2 97%   BMI 24.83 kg/m  Physical Exam Vitals and nursing note reviewed.  Constitutional:      Appearance: Normal appearance. He is normal weight.   HENT:     Head: Normocephalic.     Right Ear: External ear normal.     Left Ear: External ear normal.     Mouth/Throat:     Pharynx: Oropharynx is clear.  Eyes:     Extraocular Movements: Extraocular movements intact.     Pupils: Pupils are equal, round, and reactive to light.  Cardiovascular:     Rate and Rhythm: Normal rate and regular rhythm.  Pulmonary:     Effort: Pulmonary effort is normal.  Abdominal:     General: Abdomen is flat. Bowel sounds are normal.  Musculoskeletal:     Cervical back: Normal range of motion and neck supple.     Comments: Some diffuse swelling dorsal aspect right hand and fingers No warmth or significant ttp No effusion  Skin:    General: Skin is warm and dry.  Neurological:     Mental Status: He is alert.     ED Results / Procedures / Treatments   Labs (all labs ordered are listed, but only abnormal results are displayed) Labs Reviewed  CBC - Abnormal; Notable for the following components:      Result Value   WBC 13.1 (*)    All other components within normal  limits  BASIC METABOLIC PANEL - Abnormal; Notable for the following components:   Sodium 134 (*)    CO2 19 (*)    Glucose, Bld 117 (*)    Calcium 8.2 (*)    All other components within normal limits  URIC ACID    EKG None  Radiology DG Hand 2 View Right  Result Date: 10/27/2022 CLINICAL DATA:  Atraumatic pain to the right hand with swelling. EXAM: RIGHT HAND - 2 VIEW COMPARISON:  09/04/2022 FINDINGS: Joint narrowing and spurring affecting the interphalangeal joints and especially the second through fourth MCP joints with prominent spurring. 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 Generalized osteopenia. No acute fracture or subluxation. IMPRESSION: Unchanged arthropathy when compared to recent film. No acute finding. Electronically Signed   By: Jorje Guild M.D.   On: 10/27/2022 07:47    Procedures Procedures    Medications Ordered in ED Medications   cephALEXin (KEFLEX) capsule 500 mg (has no administration in time range)  ibuprofen (ADVIL) tablet 800 mg (800 mg Oral Given 10/27/22 [id number])    ED Course/ Medical Decision Making/ A&P                             Medical Decision Making Amount and/or Complexity of Data Reviewed Labs: ordered. Radiology: ordered.  Risk Prescription drug management.  72 year old man with right hand pain with some swelling.  Patient has had some recent injections in both of his wrists for median nerve problems. He began having this symptoms yesterday and has some swelling on the dorsal aspect Differential diagnosis includes but is not limited to Infection doubt abscess as no definitive swelling is noted however could have some cellulitis.  Patient has mildly elevated white count and will treat with Keflex.  We have discussed return precautions and need for close especially if he has fever or has increased redness. Osteoarthritis Gout-uric acid is normal Patient denies any history of trauma to this area X-Silvio Sausedo obtained and no evidence of acute abnormalities noted.  We discussed conservative therapy such as elevation and pressure He will continue the ibuprofen at home. He will pick up and take the Keflex. He understands he needs to have close follow-up and he understands his return precautions.       Final Clinical Impression(s) / ED Diagnoses Final diagnoses:  Right hand pain  Cellulitis of right upper extremity    Rx / DC Orders ED Discharge Orders          Ordered    cephALEXin (KEFLEX) 500 MG capsule  4 times daily        10/27/22 0908              Pattricia Boss, MD 10/27/22 [phone]

## 2022-10-27 NOTE — ED Notes (Signed)
Pt requesting pain meds, provider notified.

## 2023-11-08 ENCOUNTER — Encounter (HOSPITAL_BASED_OUTPATIENT_CLINIC_OR_DEPARTMENT_OTHER): Payer: Self-pay | Admitting: Emergency Medicine

## 2023-11-08 ENCOUNTER — Other Ambulatory Visit: Payer: Self-pay

## 2023-11-08 ENCOUNTER — Emergency Department (HOSPITAL_BASED_OUTPATIENT_CLINIC_OR_DEPARTMENT_OTHER)

## 2023-11-08 ENCOUNTER — Emergency Department (HOSPITAL_BASED_OUTPATIENT_CLINIC_OR_DEPARTMENT_OTHER)
Admission: EM | Admit: 2023-11-08 | Discharge: 2023-11-08 | Disposition: A | Discharge status: Home / Self Care | Attending: Emergency Medicine | Admitting: Emergency Medicine

## 2023-11-08 DIAGNOSIS — S29012A Strain of muscle and tendon of back wall of thorax, initial encounter: Secondary | ICD-10-CM

## 2023-11-08 DIAGNOSIS — N289 Disorder of kidney and ureter, unspecified: Secondary | ICD-10-CM

## 2023-11-08 DIAGNOSIS — E119 Type 2 diabetes mellitus without complications: Secondary | ICD-10-CM | POA: Insufficient documentation

## 2023-11-08 DIAGNOSIS — I10 Essential (primary) hypertension: Secondary | ICD-10-CM | POA: Diagnosis not present

## 2023-11-08 DIAGNOSIS — S29019A Strain of muscle and tendon of unspecified wall of thorax, initial encounter: Secondary | ICD-10-CM | POA: Diagnosis not present

## 2023-11-08 DIAGNOSIS — I714 Abdominal aortic aneurysm, without rupture, unspecified: Secondary | ICD-10-CM

## 2023-11-08 DIAGNOSIS — M546 Pain in thoracic spine: Secondary | ICD-10-CM | POA: Diagnosis present

## 2023-11-08 DIAGNOSIS — X58XXXA Exposure to other specified factors, initial encounter: Secondary | ICD-10-CM | POA: Diagnosis not present

## 2023-11-08 LAB — CBC WITH DIFFERENTIAL/PLATELET
Abs Immature Granulocytes: 0.03 10*3/uL (ref 0.00–0.07)
Basophils Absolute: 0.1 10*3/uL (ref 0.0–0.1)
Basophils Relative: 1 %
Eosinophils Absolute: 0.4 10*3/uL (ref 0.0–0.5)
Eosinophils Relative: 4 %
HCT: 39.5 % (ref 39.0–52.0)
Hemoglobin: 13.2 g/dL (ref 13.0–17.0)
Immature Granulocytes: 0 %
Lymphocytes Relative: 37 %
Lymphs Abs: 3.7 10*3/uL (ref 0.7–4.0)
MCH: 29.8 pg (ref 26.0–34.0)
MCHC: 33.4 g/dL (ref 30.0–36.0)
MCV: 89.2 fL (ref 80.0–100.0)
Monocytes Absolute: 0.7 10*3/uL (ref 0.1–1.0)
Monocytes Relative: 7 %
Neutro Abs: 5.1 10*3/uL (ref 1.7–7.7)
Neutrophils Relative %: 51 %
Platelets: 324 10*3/uL (ref 150–400)
RBC: 4.43 MIL/uL (ref 4.22–5.81)
RDW: 14.3 % (ref 11.5–15.5)
WBC: 9.9 10*3/uL (ref 4.0–10.5)
nRBC: 0 % (ref 0.0–0.2)

## 2023-11-08 LAB — URINALYSIS, ROUTINE W REFLEX MICROSCOPIC
Bilirubin Urine: NEGATIVE
Glucose, UA: NEGATIVE mg/dL
Ketones, ur: NEGATIVE mg/dL
Leukocytes,Ua: NEGATIVE
Nitrite: NEGATIVE
Protein, ur: 100 mg/dL — AB
Specific Gravity, Urine: 1.01 (ref 1.005–1.030)
pH: 6.5 (ref 5.0–8.0)

## 2023-11-08 LAB — BASIC METABOLIC PANEL
Anion gap: 7 (ref 5–15)
BUN: 17 mg/dL (ref 8–23)
CO2: 19 mmol/L — ABNORMAL LOW (ref 22–32)
Calcium: 8.7 mg/dL — ABNORMAL LOW (ref 8.9–10.3)
Chloride: 110 mmol/L (ref 98–111)
Creatinine, Ser: 1.01 mg/dL (ref 0.61–1.24)
GFR, Estimated: >60 mL/min (ref >60)
Glucose, Bld: 109 mg/dL — ABNORMAL HIGH (ref 70–99)
Potassium: 4.2 mmol/L (ref 3.5–5.1)
Sodium: 136 mmol/L (ref 135–145)

## 2023-11-08 LAB — URINALYSIS, MICROSCOPIC (REFLEX)
Squamous Epithelial / HPF: NONE SEEN /HPF (ref 0–5)
WBC, UA: NONE SEEN WBC/hpf (ref 0–5)

## 2023-11-08 MED ORDER — LIDOCAINE 5 % EX PTCH: 1.0000 | MEDICATED_PATCH | CUTANEOUS | 0 refills | Status: AC

## 2023-11-08 MED ORDER — CYCLOBENZAPRINE HCL 10 MG PO TABS: 5.0000 mg | ORAL_TABLET | Freq: Two times a day (BID) | ORAL | 0 refills | Status: AC | PRN

## 2023-11-08 MED ORDER — IOHEXOL 350 MG/ML SOLN
100.0000 mL | Freq: Once | INTRAVENOUS | Status: AC | PRN
  Administered 2023-11-08: 100 mL via INTRAVENOUS

## 2023-11-08 NOTE — ED Triage Notes (Signed)
 Pt c/o left back/flank pain x 3 weeks, denies injury, denies urinary symptoms, no hx of kidney stones, was seen by PCP for same

## 2023-11-08 NOTE — ED Provider Notes (Signed)
 [facility] Provider Note   CSN: [id number] Arrival date & time: 11/08/23  0545     History  Chief Complaint  Patient presents with   Flank Pain    Adam Melendez is a 73 y.o. male.  Patient is a 73 year old male with a past medical history of hypertension, diabetes, AAA presenting to the emergency department with left-sided flank pain.  The patient states that he has had pain in his left flank for the last 3 weeks.  He states initially it was coming and going but more recently the pain has become constant.  He states he initially felt like he pulled a muscle and has been taking Advil at home without significant relief.  He states it is a throbbing type of pain and the pain has not moved.  He states the pain is worse with movements.  He denies any associated fever, nausea, vomiting, diarrhea or constipation, dysuria or hematuria.  He was seen by his primary doctor yesterday who performed labs and urine that were within normal range and suspected that he might have a kidney stone and wanted to evaluate his AAA and was recommended that he come to the ER for CT imaging.  The history is provided by the patient and medical records.  Flank Pain       Home Medications Prior to Admission medications   Medication Sig Start Date End Date Taking? Authorizing Provider  cyclobenzaprine (FLEXERIL) 10 MG tablet Take 0.5 tablets (5 mg total) by mouth 2 (two) times daily as needed for muscle spasms. 11/08/23  Yes Theresia Lo, [place] K, DO  lidocaine (LIDODERM) 5 % Place 1 patch onto the skin daily. Remove & Discard patch within 12 hours or as directed by MD 11/08/23  Yes Theresia Lo, [place] K, DO  Capsaicin-Menthol-Methyl Sal (CAPSAICIN-METHYL SAL-MENTHOL) 0.025-1-12 % CREA Apply 1 Application topically 2 (two) times daily. 09/04/22   Horton, Clabe Seal, DO  cephALEXin (KEFLEX) 500 MG capsule Take 1 capsule (500 mg total) by mouth 4 (four) times daily. 10/27/22   Margarita Grizzle, MD  colchicine 0.6 MG tablet Take 1 tablet (0.6 mg total) by mouth 2 (two) times daily for 14 days. 09/15/22 09/29/22  Loetta Rough, MD  predniSONE (DELTASONE) 10 MG tablet Take 3 tablets (30 mg total) by mouth daily. Take 3 tablets (30 mg total) by mouth once daily for 5 week.  Take 2 tablets (20 mg total) once daily for 3 days.  Take 1 tablet (10 mg total) once daily for 2 days. 09/15/22   Loetta Rough, MD      Allergies    Patient has no known allergies.    Review of Systems   Review of Systems  Genitourinary:  Positive for flank pain.    Physical Exam Updated Vital Signs BP (!) 140/74 (BP Location: Left Arm)   Pulse 62   Temp 97.7 F (36.5 C) (Oral)   Resp 18   Ht 5\' 11"  (1.803 m)   Wt 80.7 kg   SpO2 98%   BMI 24.83 kg/m  Physical Exam Vitals and nursing note reviewed.  Constitutional:      General: He is not in acute distress.    Appearance: Normal appearance.  HENT:     Head: Normocephalic and atraumatic.     Nose: Nose normal.     Mouth/Throat:     Mouth: Mucous membranes are moist.     Pharynx: Oropharynx is clear.  Eyes:  Extraocular Movements: Extraocular movements intact.     Conjunctiva/sclera: Conjunctivae normal.  Neck:     Comments: No midline neck tenderness Cardiovascular:     Rate and Rhythm: Normal rate and regular rhythm.     Heart sounds: Normal heart sounds.  Pulmonary:     Effort: Pulmonary effort is normal.     Breath sounds: Normal breath sounds.  Abdominal:     General: Abdomen is flat.     Palpations: Abdomen is soft.     Tenderness: There is no abdominal tenderness. There is left CVA tenderness. There is no right CVA tenderness.  Musculoskeletal:        General: Normal range of motion.     Cervical back: Normal range of motion and neck supple.     Comments: No midline back tenderness, left mid thoracic muscle tenderness to palpation, no overlying skin changes  Skin:    General: Skin is warm and dry.  Neurological:      General: No focal deficit present.     Mental Status: He is alert and oriented to person, place, and time.  Psychiatric:        Mood and Affect: Mood normal.        Behavior: Behavior normal.     ED Results / Procedures / Treatments   Labs (all labs ordered are listed, but only abnormal results are displayed) Labs Reviewed  URINALYSIS, ROUTINE W REFLEX MICROSCOPIC - Abnormal; Notable for the following components:      Result Value   Color, Urine STRAW (*)    Hgb urine dipstick TRACE (*)    Protein, ur 100 (*)    All other components within normal limits  BASIC METABOLIC PANEL - Abnormal; Notable for the following components:   CO2 19 (*)    Glucose, Bld 109 (*)    Calcium 8.7 (*)    All other components within normal limits  URINALYSIS, MICROSCOPIC (REFLEX) - Abnormal; Notable for the following components:   Bacteria, UA RARE (*)    All other components within normal limits  CBC WITH DIFFERENTIAL/PLATELET    EKG None  Radiology CT Angio Abd/Pel W and/or Wo Contrast Result Date: 11/08/2023 CLINICAL DATA:  Aortic aneurysm, left flank pain EXAM: CTA ABDOMEN AND PELVIS WITHOUT AND WITH CONTRAST TECHNIQUE: Multidetector CT imaging of the abdomen and pelvis was performed using the standard protocol during bolus administration of intravenous contrast. Multiplanar reconstructed images and MIPs were obtained and reviewed to evaluate the vascular anatomy. RADIATION DOSE REDUCTION: This exam was performed according to the departmental dose-optimization program which includes automated exposure control, adjustment of the mA and/or kV according to patient size and/or use of iterative reconstruction technique. CONTRAST:  OMNIPAQUE IOHEXOL 350 MG/ML SOLN COMPARISON:  01/15/2014 FINDINGS: VASCULAR Aorta: Moderate calcified atheromatous plaque. 3.4 cm fusiform infrarenal aneurysm (previously 3 cm) with right anterolateral eccentric thrombosed intramural hematoma as before. No evidence of leak  or impending rupture. No dissection or stenosis. Celiac: Calcified ostial plaque with stenosis over length of at least 1.7 cm, partially attributable to the median arcuate ligament of the diaphragm, patent distally with classic trifurcation anatomy. SMA: Partially calcified ostial plaque resulting in short segment stenosis of at least mild severity, patent distally with classic distal branch anatomy. Renals: Single bilaterally, both with calcified ostial plaque resulting in short segment stenosis of at least mild severity, patent distally. IMA: Origin stenosis, arising from the aneurysmal segment, patent distally. Inflow: Moderate calcified atheromatous plaque through the common iliac arteries. Eccentric  1.9 cm aneurysmal dilatation of the distal left common iliac. External iliac arteries minimally atheromatous, patent. Proximal Outflow: Mildly atheromatous, patent. Veins: No obvious venous abnormality within the limitations of this arterial phase study. Review of the MIP images confirms the above findings. NON-VASCULAR Lower chest: No pleural or pericardial effusion. Coronary and aortic calcifications. Depending ground-glass opacities posteriorly in both lung bases. Hepatobiliary: No focal liver abnormality is seen. No gallstones, gallbladder wall thickening, or biliary dilatation. Pancreas: Unremarkable. No pancreatic ductal dilatation or surrounding inflammatory changes. Spleen: Normal in size without focal abnormality. Adrenals/Urinary Tract: Bilateral low-attenuation adrenal fullness, stable since 01/15/2014. 1.1 cm 51 HU lesion from the upper pole left kidney, nonspecific. Smaller bilateral renal cortical cysts. No hydronephrosis. Urinary bladder physiologically distended. Stomach/Bowel: Stomach is nondistended, without acute finding. Small bowel decompressed. Normal appendix. The colon is partially distended, with a few sigmoid diverticula, no acute findings. Lymphatic: No abdominal or pelvic adenopathy.  Reproductive: . prostate enlargement. Other: No ascites.  No free air. Musculoskeletal: Lumbar levoscoliosis apex L3 with multilevel spondylitic change. Bilateral hip DJD. IMPRESSION: 1. 3.4 cm infrarenal abdominal aortic aneurysm (previously 3 cm) with thrombosed intramural hematoma. No evidence of leak or impending rupture. Recommend follow-up ultrasound every 3 years. 2. 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 3. 1.1 cm indeterminate left renal lesion. Consider elective outpatient MR for further characterization. 4. Depending ground-glass opacities posteriorly in both lung bases. 5.  Aortic Atherosclerosis (ICD10-I70.0). Electronically Signed   By: Corlis Leak M.D.   On: 11/08/2023 12:25    Procedures Procedures    Medications Ordered in ED Medications  iohexol (OMNIPAQUE) 350 MG/ML injection 100 mL (100 mLs Intravenous Contrast Given 11/08/23 0817)    ED Course/ Medical Decision Making/ A&P Clinical Course as of 11/08/23 1518  Fri Nov 08, 2023  0916 Trace Hgb with rare bacteria in the urine, no urinary symptoms making UTI unlikely. CT read is pending. [VK]  1010 Received message from radiology, mildly increased size of AAA from 3.0 cm to 3.2 cm, otherwise no signs of leak or other complication on CT, final CT report pending. [VK]  1229 Final CT read with 3.4 cm AAA with thrombosed intramural hematoma but no other signs of complication and recommended outpatient follow up in 3 years, incidental renal cyst that can be followed up outpatient, no evidence of stone. Suspect pain is musculoskeletal. Recommended continued symptomatic treatment and primary care follow up, likely would benefit from PT. [VK]    Clinical Course User Index [VK] Rexford Maus, DO                                 Medical Decision Making This patient presents to the ED with chief complaint(s) of left back/flank pain with pertinent past medical history of hypertension, diabetes, AAA which further complicates the  presenting complaint. The complaint involves an extensive differential diagnosis and also carries with it a high risk of complications and morbidity.    The differential diagnosis includes muscle strain or spasm, nephrolithiasis, pyelonephritis, enlarging AAA, ruptured AAA or dissection less likely due to the chronicity of the pain, no rash making shingles unlikely  Additional history obtained: Additional history obtained from N/A Records reviewed Care Everywhere/External Records  ED Course and Reassessment: On patient's arrival he is hemodynamically stable in no acute distress.  Did have labs and urine performed by his primary doctor yesterday that showed no signs of infection explain his symptoms.  He was recommended CTA and will have this done today.  He declined any pain control at this time and will be closely reassessed.  Independent labs interpretation:  The following labs were independently interpreted: within normal range  Independent visualization of imaging: - I independently visualized the following imaging with scope of interpretation limited to determining acute life threatening conditions related to emergency care: CT angio AP, which revealed mild increased size of AAA, otherwise no acute findings to explain pain  Consultation: - Consulted or discussed management/test interpretation w/ external professional: N/A  Consideration for admission or further workup: Patient has no emergent conditions requiring admission or further work-up at this time and is stable for discharge home with primary care follow-up  Social Determinants of health: N/A    Amount and/or Complexity of Data Reviewed Labs: ordered. Radiology: ordered.  Risk Prescription drug management.          Final Clinical Impression(s) / ED Diagnoses Final diagnoses:  Strain of thoracic paraspinal muscles excluding T1 and T2 levels, initial encounter  Abdominal aortic aneurysm (AAA) without rupture,  unspecified part (HCC)  Renal lesion    Rx / DC Orders ED Discharge Orders          Ordered    cyclobenzaprine (FLEXERIL) 10 MG tablet  2 times daily PRN        11/08/23 1241    lidocaine (LIDODERM) 5 %  Every 24 hours        11/08/23 1241              Rexford Maus, DO 11/08/23 1518

## 2023-11-08 NOTE — Discharge Instructions (Addendum)
 You were seen in the emergency department for your left-sided back pain.  Your workup showed no signs of kidney stone.  Your aneurysm was slightly larger compared to your last imaging at 3.4 cm and can continue to be monitored every 3 years with ultrasound by your primary doctor.  Your workup also incidentally showed that you have a lesion on your left kidney that can be further evaluated by your primary doctor.  He can continue to take Tylenol or Motrin as needed for your pain both can be taken up to every 6 hours.  You can also use lidocaine patches and Flexeril which is a muscle relaxer.  This can make you drowsy so do not take it while driving, working or operating heavy machinery.  You should talk to your primary doctor about possible physical therapy at this point.  You should return to the emergency department for numbness or weakness in your arms or legs, significantly worsening pain or unable to walk or if you have any other new or concerning symptoms.

## 2024-07-27 DEATH — deceased
